# Patient Record
Sex: Male | Born: 2000
Health system: Southern US, Community
[De-identification: ages and names within clinical notes are randomized; demographics above are authoritative.]

## PROBLEM LIST (undated history)

## (undated) DIAGNOSIS — R079 Chest pain, unspecified: Secondary | ICD-10-CM

## (undated) DIAGNOSIS — R002 Palpitations: Secondary | ICD-10-CM

## (undated) HISTORY — DX: Chest pain, unspecified: R07.9

## (undated) HISTORY — DX: Palpitations: R00.2

---
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
## 2019-04-09 IMAGING — DX DG HAND COMPLETE 3+V*L*
3 series · 3 of 3 positions shown · non-contrast
Comparison: None.

CLINICAL DATA: Status post trauma to the left ring finger today.

EXAM:
LEFT HAND - COMPLETE 3+ VIEW

[hand pa]
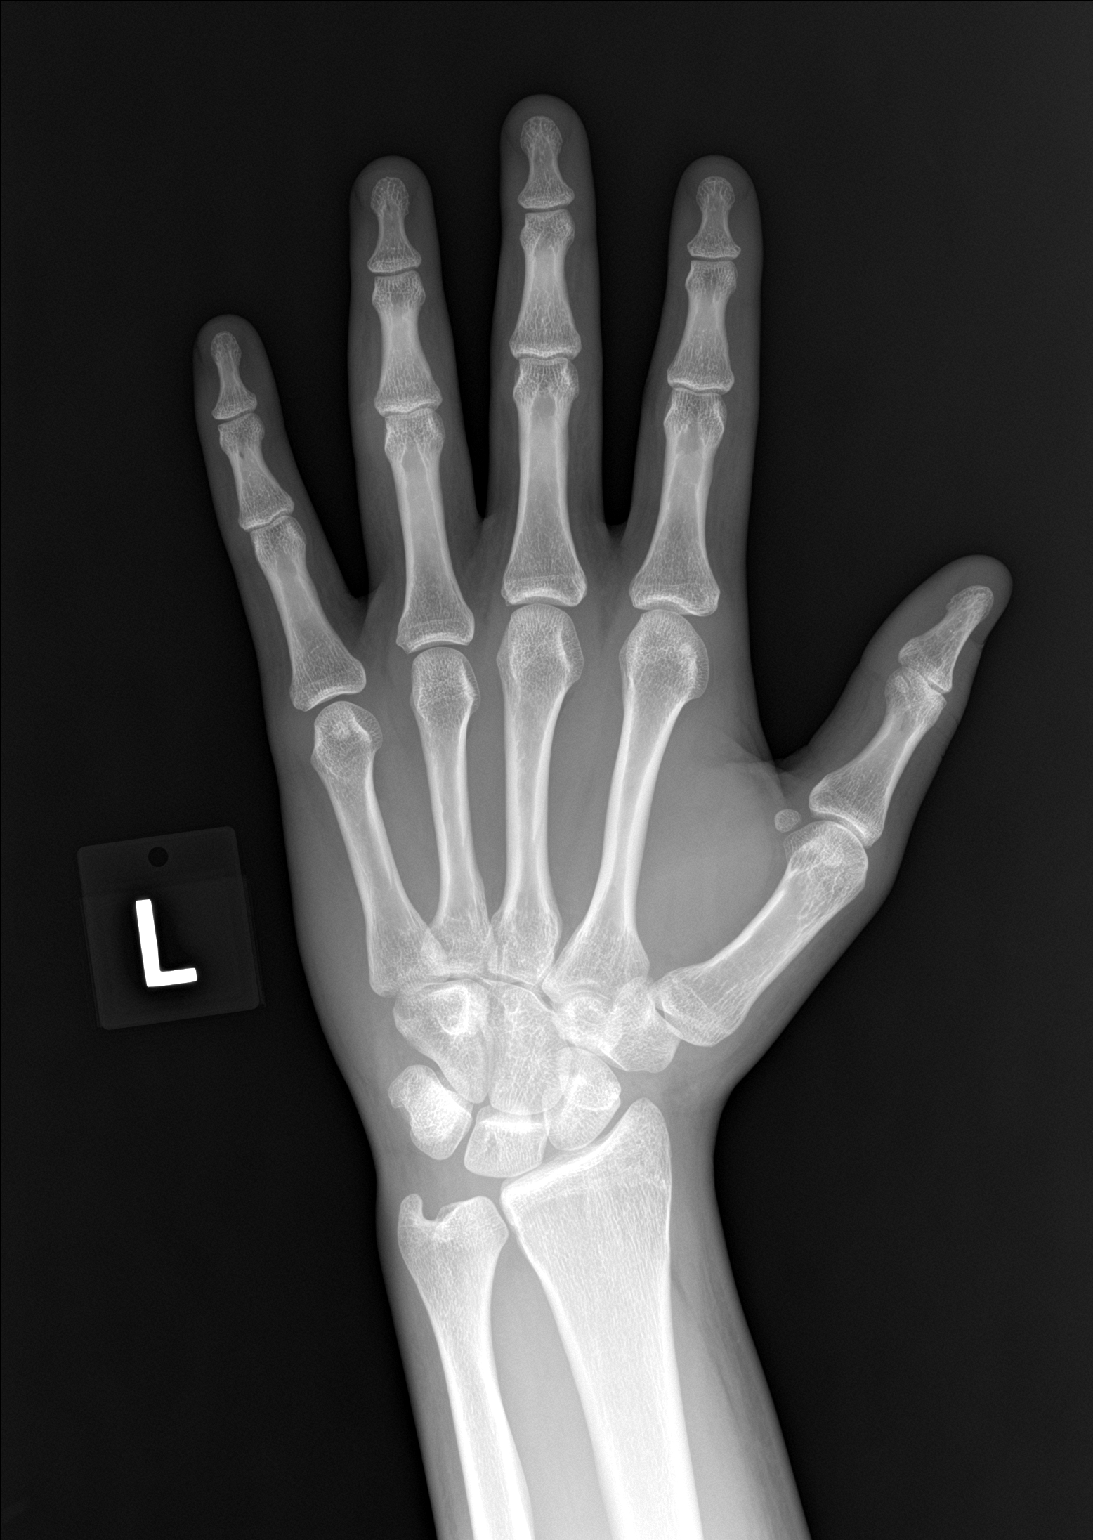

[hand obl]
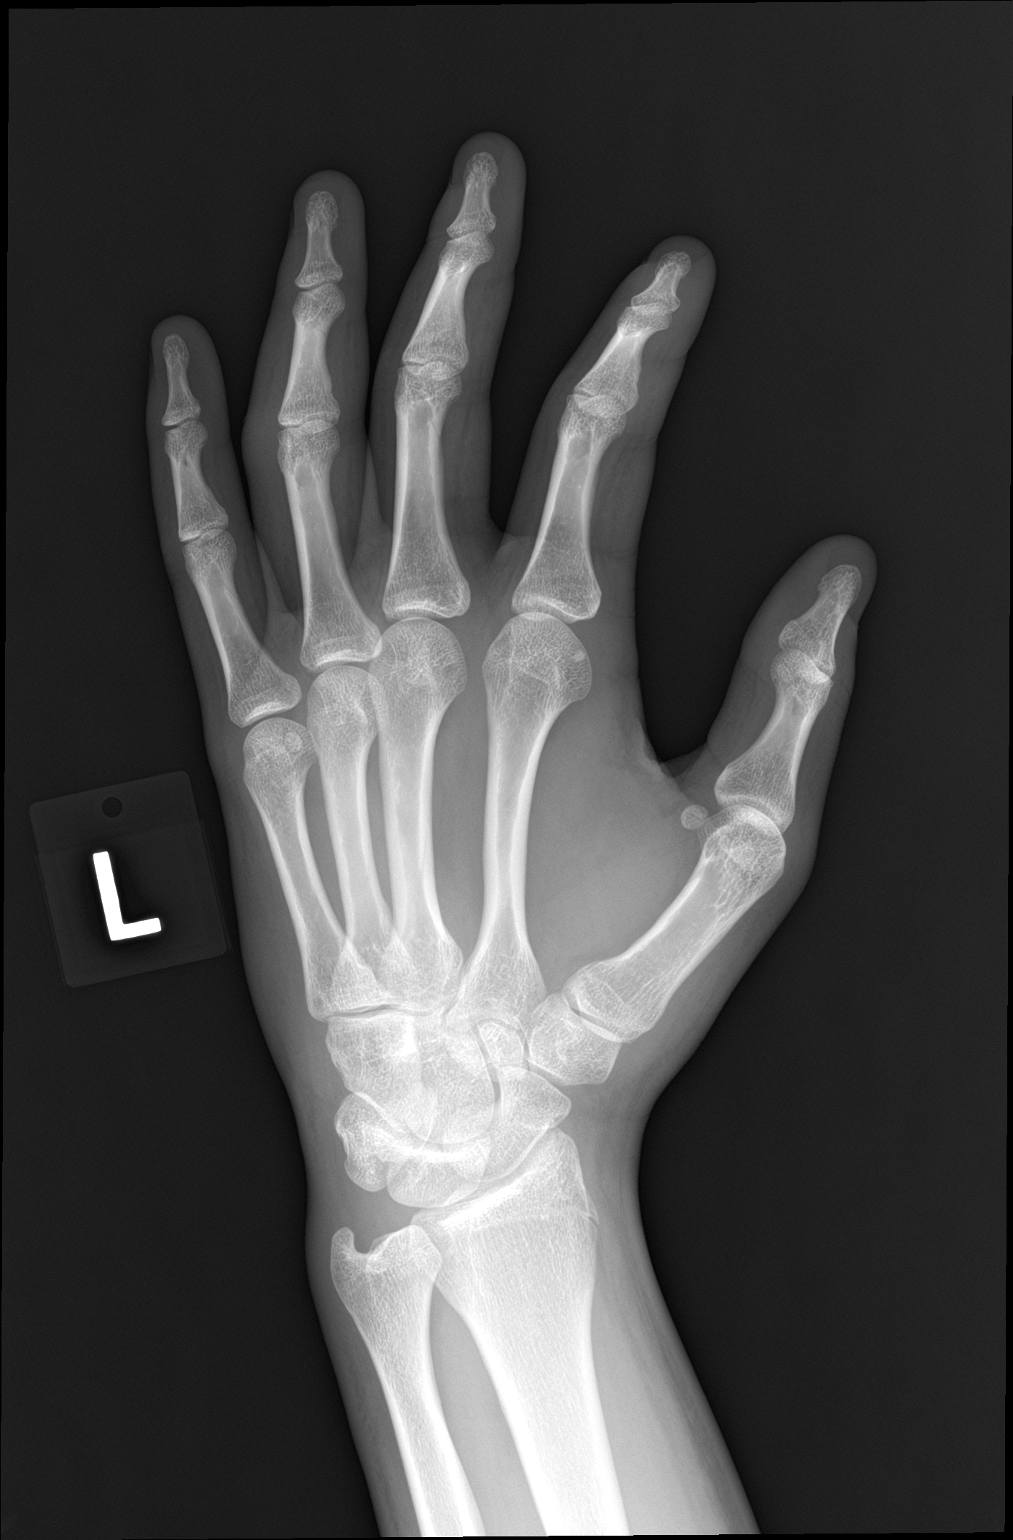

[hand lat]
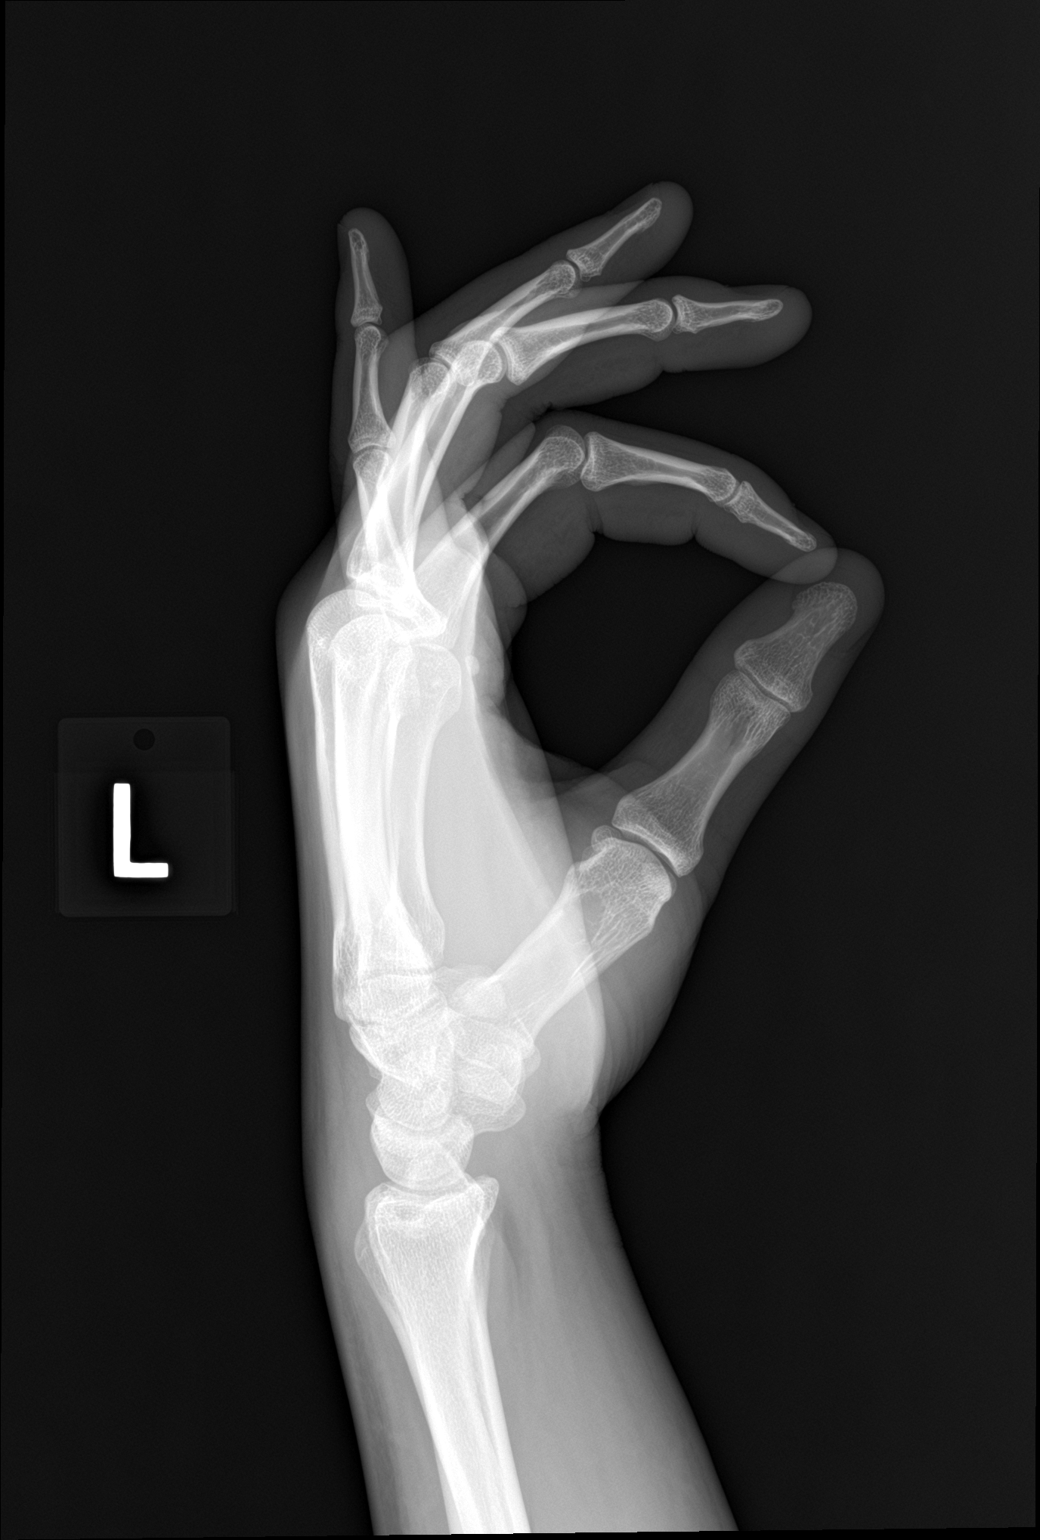

[3 of 3 positions shown; findings below may reference images not displayed]

FINDINGS: There is no evidence of fracture or dislocation. There is no
evidence of arthropathy or other focal bone abnormality. There is
mild soft tissue swelling of the ring finger.
IMPRESSION: No acute fracture or dislocation. Mild soft tissue swelling of the
ring finger.

## 2022-04-22 DIAGNOSIS — R5383 Other fatigue: Secondary | ICD-10-CM | POA: Diagnosis not present

## 2022-04-22 DIAGNOSIS — R002 Palpitations: Secondary | ICD-10-CM | POA: Diagnosis not present

## 2022-04-25 ENCOUNTER — Encounter: Payer: Self-pay | Admitting: Cardiology

## 2022-04-27 ENCOUNTER — Other Ambulatory Visit: Payer: Self-pay

## 2022-04-27 DIAGNOSIS — R002 Palpitations: Secondary | ICD-10-CM

## 2022-05-02 ENCOUNTER — Ambulatory Visit: Payer: Commercial Managed Care - PPO | Attending: Cardiology

## 2022-05-02 DIAGNOSIS — R002 Palpitations: Secondary | ICD-10-CM | POA: Diagnosis not present

## 2022-05-18 DIAGNOSIS — R002 Palpitations: Secondary | ICD-10-CM | POA: Diagnosis not present

## 2022-05-18 DIAGNOSIS — R079 Chest pain, unspecified: Secondary | ICD-10-CM | POA: Insufficient documentation

## 2022-05-19 NOTE — Progress Notes (Signed)
Cardiology Office Note:    Date:  05/20/2022   ID:  Danny Bennett, DOB May 26, 2000, MRN [id number]  PCP:  Penelope Galas, MD  Cardiologist:  Norman Herrlich, MD   Referring MD: Penelope Galas, MD  ASSESSMENT:    1. Chest pain of uncertain etiology   2. Palpitations    PLAN:    In order of problems listed above:  Appears musculoskeletal, echocardiogram if normal no further cardiac diagnostic testing and follow-up as needed Monitor without arrhythmia  Next appointment as needed   Medication Adjustments/Labs and Tests Ordered: Current medicines are reviewed at length with the patient today.  Concerns regarding medicines are outlined above.  No orders of the defined types were placed in this encounter.  No orders of the defined types were placed in this encounter.    Chest pain and palpitations  History of Present Illness:    Danny Bennett is a 22 y.o. male who is being seen today for the evaluation of palpitation and chest pain at the request of Chow, Zannie Cove, MD.  EKG performed [phone] showed sinus bradycardia 55 bpm normal QT interval normal EKG Has had a CBC performed that was normal hemoglobin 16.6 platelets 162,000 CMP normal with glucose of 91 creatinine 1.18 potassium 5.1 and normal liver function test.  He had been athlete in high school wrestling He does repetitive lifting but not heavy and has had no chest wall trauma except a fall as a child where he struck his chest. Is been aware of his heart beating we did a ZIO monitor which showed no  arrhythmia and his symptoms are associated with sinus rhythm He takes no over-the-counter proarrhythmic drugs He is having intermittent chest pain nonexertional sharp left precordium No shortness of breath syncope Past Medical History:  Diagnosis Date   Chest pain    Palpitations     Past Surgical History:  Procedure Laterality Date   OPEN REDUCTION INTERNAL FIXATION (ORIF) DISTAL RADIAL FRACTURE Left 12/27/2017   Procedure:  OPEN REDUCTION INTERNAL FIXATION (ORIF) LEFT  DISTAL RADIAL FRACTURE;  Surgeon: Bjorn Pippin, MD;  Location: [facility];  Service: Orthopedics;  Laterality: Left;    Current Medications: No outpatient medications have been marked as taking for the 05/20/22 encounter (Office Visit) with Baldo Daub, MD.     Allergies:   Patient has no known allergies.   Social History   Socioeconomic History   Marital status: Single    Spouse name: Not on file   Number of children: Not on file   Years of education: Not on file   Highest education level: Not on file  Occupational History   Not on file  Tobacco Use   Smoking status: Never   Smokeless tobacco: Never  Vaping Use   Vaping Use: Former  Substance and Sexual Activity   Alcohol use: Yes    Comment: Socially   Drug use: Never   Sexual activity: Never  Other Topics Concern   Not on file  Social History Narrative   Not on file   Social Determinants of Health   Financial Resource Strain: Not on file  Food Insecurity: Not on file  Transportation Needs: Not on file  Physical Activity: Not on file  Stress: Not on file  Social Connections: Not on file     Family History: The patient's family history is not on file.  ROS:   ROS Please see the history of present illness.     All  other systems reviewed and are negative.  EKGs/Labs/Other Studies Reviewed:    The following studies were reviewed today:       EKG:  EKG is  ordered today.  The ekg ordered today is personally reviewed and demonstrates normal for age no finding of hypertrophic cardiomyopathy  Physical Exam:    VS:  BP 134/82 (BP Location: Right Arm, Patient Position: Sitting, Cuff Size: Normal)   Pulse 62   Ht 6\' 3"  (1.905 m)   Wt 192 lb 9.6 oz (87.4 kg)   SpO2 99%   BMI 24.07 kg/m     Wt Readings from Last 3 Encounters:  05/20/22 192 lb 9.6 oz (87.4 kg)  04/22/22 205 lb 12.8 oz (93.4 kg)  12/27/17 223 lb 1.7 oz (101.2 kg) (98 %, Z=  2.00)*   * Growth percentiles are based on CDC (Boys, 2-20 Years) data.     GEN:  Well nourished, well developed in no acute distress HEENT: Normal NECK: No JVD; No carotid bruits LYMPHATICS: No lymphadenopathy CARDIAC: RRR, no murmurs, rubs, gallops RESPIRATORY:  Clear to auscultation without rales, wheezing or rhonchi  ABDOMEN: Soft, non-tender, non-distended MUSCULOSKELETAL:  No edema; No deformity  SKIN: Warm and dry NEUROLOGIC:  Alert and oriented x 3 PSYCHIATRIC:  Normal affect     Signed, Norman Herrlich, MD  05/20/2022 10:16 AM    [facility]

## 2022-05-20 ENCOUNTER — Encounter: Payer: Self-pay | Admitting: Cardiology

## 2022-05-20 ENCOUNTER — Ambulatory Visit: Payer: Commercial Managed Care - PPO | Attending: Cardiology | Admitting: Cardiology

## 2022-05-20 VITALS — BP 134/82 | HR 62 | Ht 75.0 in | Wt 192.6 lb

## 2022-05-20 DIAGNOSIS — R002 Palpitations: Secondary | ICD-10-CM

## 2022-05-20 DIAGNOSIS — R079 Chest pain, unspecified: Secondary | ICD-10-CM

## 2022-05-20 NOTE — Patient Instructions (Addendum)
Medication Instructions:  Your physician recommends that you continue on your current medications as directed. Please refer to the Current Medication list given to you today.  *If you need a refill on your cardiac medications before your next appointment, please call your pharmacy*   Lab Work: None If you have labs (blood work) drawn today and your tests are completely normal, you will receive your results only by: MyChart Message (if you have MyChart) OR A paper copy in the mail If you have any lab test that is abnormal or we need to change your treatment, we will call you to review the results.   Testing/Procedures: Your physician has requested that you have an echocardiogram. Echocardiography is a painless test that uses sound waves to create images of your heart. It provides your doctor with information about the size and shape of your heart and how well your heart's chambers and valves are working. This procedure takes approximately one hour. There are no restrictions for this procedure. Please do NOT wear cologne, perfume, aftershave, or lotions (deodorant is allowed). Please arrive 15 minutes prior to your appointment time.    Follow-Up: At [facility], you and your health needs are our priority.  As part of our continuing mission to provide you with exceptional heart care, we have created designated Provider Care Teams.  These Care Teams include your primary Cardiologist (physician) and Advanced Practice Providers (APPs -  Physician Assistants and Nurse Practitioners) who all work together to provide you with the care you need, when you need it.  We recommend signing up for the patient portal called "MyChart".  Sign up information is provided on this After Visit Summary.  MyChart is used to connect with patients for Virtual Visits (Telemedicine).  Patients are able to view lab/test results, encounter notes, upcoming appointments, etc.  Non-urgent messages can be sent to your  provider as well.   To learn more about what you can do with MyChart, go to [email or web address].    Your next appointment:   Follow up as needed   Provider:   Brian Munley, MD    Other Instructions None    1. Avoid all over-the-counter antihistamines except Claritin/Loratadine and Zyrtec/Cetrizine. 2. Avoid all combination including cold sinus allergies flu decongestant and sleep medications 3. You can use Robitussin DM Mucinex and Mucinex DM for cough. 4. can use Tylenol aspirin ibuprofen and naproxen but no combinations such as sleep or sinus.  

## 2022-06-08 ENCOUNTER — Ambulatory Visit: Payer: Commercial Managed Care - PPO | Attending: Cardiology

## 2022-06-08 DIAGNOSIS — R002 Palpitations: Secondary | ICD-10-CM | POA: Diagnosis not present

## 2022-06-08 DIAGNOSIS — R079 Chest pain, unspecified: Secondary | ICD-10-CM

## 2022-06-08 LAB — ECHOCARDIOGRAM COMPLETE: S' Lateral: 3.9 cm

## 2022-06-10 DIAGNOSIS — Z136 Encounter for screening for cardiovascular disorders: Secondary | ICD-10-CM | POA: Diagnosis not present
